# Patient Record
Sex: Female | Born: 1955 | Race: White | Hispanic: No | Marital: Married | Smoking: Current every day smoker
Health system: Southern US, Community
[De-identification: ages and names within clinical notes are randomized; demographics above are authoritative.]

## PROBLEM LIST (undated history)

## (undated) DIAGNOSIS — A0472 Enterocolitis due to Clostridium difficile, not specified as recurrent: Secondary | ICD-10-CM

## (undated) DIAGNOSIS — I10 Essential (primary) hypertension: Secondary | ICD-10-CM

## (undated) HISTORY — DX: Essential (primary) hypertension: I10

## (undated) HISTORY — DX: Enterocolitis due to Clostridium difficile, not specified as recurrent: A04.72

## (undated) HISTORY — PX: NASAL SINUS SURGERY: SHX719

## (undated) HISTORY — PX: OTHER SURGICAL HISTORY: SHX169

## (undated) HISTORY — PX: ABDOMINAL HYSTERECTOMY: SHX81

## (undated) HISTORY — PX: PELVIC LAPAROSCOPY: SHX162

---

## 1978-05-27 HISTORY — PX: CERVICAL CONE BIOPSY: SUR198

## 1998-07-04 ENCOUNTER — Ambulatory Visit (HOSPITAL_COMMUNITY): Admission: RE | Admit: 1998-07-04 | Discharge: 1998-07-04 | Payer: Self-pay | Admitting: Family Medicine

## 1998-07-04 ENCOUNTER — Encounter: Payer: Self-pay | Admitting: Family Medicine

## 1998-10-04 ENCOUNTER — Encounter: Payer: Self-pay | Admitting: Family Medicine

## 1998-10-04 ENCOUNTER — Ambulatory Visit (HOSPITAL_COMMUNITY): Admission: RE | Admit: 1998-10-04 | Discharge: 1998-10-04 | Payer: Self-pay | Admitting: Family Medicine

## 1998-12-28 ENCOUNTER — Encounter: Payer: Self-pay | Admitting: *Deleted

## 1998-12-28 ENCOUNTER — Ambulatory Visit (HOSPITAL_COMMUNITY): Admission: RE | Admit: 1998-12-28 | Discharge: 1998-12-28 | Payer: Self-pay | Admitting: *Deleted

## 1999-03-21 ENCOUNTER — Other Ambulatory Visit: Admission: RE | Admit: 1999-03-21 | Discharge: 1999-03-21 | Payer: Self-pay | Admitting: *Deleted

## 1999-03-21 ENCOUNTER — Encounter (INDEPENDENT_AMBULATORY_CARE_PROVIDER_SITE_OTHER): Payer: Self-pay | Admitting: Specialist

## 2000-04-29 ENCOUNTER — Encounter: Payer: Self-pay | Admitting: Family Medicine

## 2000-04-29 ENCOUNTER — Encounter: Admission: RE | Admit: 2000-04-29 | Discharge: 2000-04-29 | Payer: Self-pay | Admitting: Family Medicine

## 2001-03-13 ENCOUNTER — Ambulatory Visit (HOSPITAL_COMMUNITY): Admission: RE | Admit: 2001-03-13 | Discharge: 2001-03-13 | Payer: Self-pay | Admitting: Family Medicine

## 2001-07-17 ENCOUNTER — Other Ambulatory Visit: Admission: RE | Admit: 2001-07-17 | Discharge: 2001-07-17 | Payer: Self-pay | Admitting: Obstetrics and Gynecology

## 2002-09-17 ENCOUNTER — Encounter: Payer: Self-pay | Admitting: Emergency Medicine

## 2002-09-17 ENCOUNTER — Emergency Department (HOSPITAL_COMMUNITY): Admission: EM | Admit: 2002-09-17 | Discharge: 2002-09-17 | Payer: Self-pay | Admitting: Emergency Medicine

## 2002-10-13 ENCOUNTER — Other Ambulatory Visit: Admission: RE | Admit: 2002-10-13 | Discharge: 2002-10-13 | Payer: Self-pay | Admitting: Obstetrics and Gynecology

## 2003-02-17 ENCOUNTER — Encounter: Payer: Self-pay | Admitting: Obstetrics and Gynecology

## 2003-02-17 ENCOUNTER — Encounter: Admission: RE | Admit: 2003-02-17 | Discharge: 2003-02-17 | Payer: Self-pay | Admitting: Obstetrics and Gynecology

## 2003-06-26 ENCOUNTER — Emergency Department (HOSPITAL_COMMUNITY): Admission: EM | Admit: 2003-06-26 | Discharge: 2003-06-26 | Payer: Self-pay | Admitting: Emergency Medicine

## 2003-10-16 ENCOUNTER — Emergency Department (HOSPITAL_COMMUNITY): Admission: EM | Admit: 2003-10-16 | Discharge: 2003-10-16 | Payer: Self-pay | Admitting: Family Medicine

## 2004-03-14 ENCOUNTER — Other Ambulatory Visit: Admission: RE | Admit: 2004-03-14 | Discharge: 2004-03-14 | Payer: Self-pay | Admitting: Obstetrics and Gynecology

## 2004-07-28 ENCOUNTER — Emergency Department (HOSPITAL_COMMUNITY): Admission: EM | Admit: 2004-07-28 | Discharge: 2004-07-28 | Payer: Self-pay | Admitting: Emergency Medicine

## 2004-09-13 ENCOUNTER — Encounter: Admission: RE | Admit: 2004-09-13 | Discharge: 2004-09-13 | Payer: Self-pay | Admitting: Podiatry

## 2005-03-22 ENCOUNTER — Encounter: Admission: RE | Admit: 2005-03-22 | Discharge: 2005-03-22 | Payer: Self-pay | Admitting: Family Medicine

## 2005-09-11 ENCOUNTER — Other Ambulatory Visit: Admission: RE | Admit: 2005-09-11 | Discharge: 2005-09-11 | Payer: Self-pay | Admitting: Gynecology

## 2005-10-17 ENCOUNTER — Encounter: Admission: RE | Admit: 2005-10-17 | Discharge: 2005-10-17 | Payer: Self-pay | Admitting: Obstetrics and Gynecology

## 2006-03-24 ENCOUNTER — Encounter: Admission: RE | Admit: 2006-03-24 | Discharge: 2006-03-24 | Payer: Self-pay | Admitting: Family Medicine

## 2006-04-02 ENCOUNTER — Encounter: Admission: RE | Admit: 2006-04-02 | Discharge: 2006-04-02 | Payer: Self-pay | Admitting: Family Medicine

## 2007-03-02 ENCOUNTER — Encounter: Admission: RE | Admit: 2007-03-02 | Discharge: 2007-03-02 | Payer: Self-pay | Admitting: Obstetrics and Gynecology

## 2007-05-26 ENCOUNTER — Other Ambulatory Visit: Admission: RE | Admit: 2007-05-26 | Discharge: 2007-05-26 | Payer: Self-pay | Admitting: Obstetrics and Gynecology

## 2008-01-27 ENCOUNTER — Encounter: Admission: RE | Admit: 2008-01-27 | Discharge: 2008-01-27 | Payer: Self-pay | Admitting: Family Medicine

## 2008-05-21 ENCOUNTER — Emergency Department (HOSPITAL_COMMUNITY): Admission: EM | Admit: 2008-05-21 | Discharge: 2008-05-22 | Payer: Self-pay | Admitting: Emergency Medicine

## 2008-07-14 ENCOUNTER — Encounter: Admission: RE | Admit: 2008-07-14 | Discharge: 2008-07-14 | Payer: Self-pay | Admitting: Family Medicine

## 2008-11-09 ENCOUNTER — Ambulatory Visit: Payer: Self-pay | Admitting: Obstetrics and Gynecology

## 2008-11-09 ENCOUNTER — Other Ambulatory Visit: Admission: RE | Admit: 2008-11-09 | Discharge: 2008-11-09 | Payer: Self-pay | Admitting: Obstetrics and Gynecology

## 2008-11-09 ENCOUNTER — Encounter: Payer: Self-pay | Admitting: Obstetrics and Gynecology

## 2008-11-22 ENCOUNTER — Encounter: Admission: RE | Admit: 2008-11-22 | Discharge: 2008-11-22 | Payer: Self-pay | Admitting: Obstetrics and Gynecology

## 2009-05-17 ENCOUNTER — Emergency Department (HOSPITAL_COMMUNITY): Admission: EM | Admit: 2009-05-17 | Discharge: 2009-05-17 | Payer: Self-pay | Admitting: Emergency Medicine

## 2010-01-06 IMAGING — CT CT PELVIS W/ CM
2 of 5 series · 16 of 46 positions shown, 18 images · IV contrast (APPLIED)
Comparison: None

CT ABDOMEN

CLINICAL DATA: Abdominal pain

CT ABDOMEN AND PELVIS WITH CONTRAST
TECHNIQUE: Multidetector CT imaging of the abdomen and pelvis was
performed using the standard protocol following bolus
administration of intravenous contrast.
Contrast: 125 ml of omni 300

[Series 2: abd_pel 5.0 b40f st · axial · 0.57mm/px · z∈[-432,-72]mm · 13 of 82 slices shown, 15 images]
[im 5/82  soft-tissue]
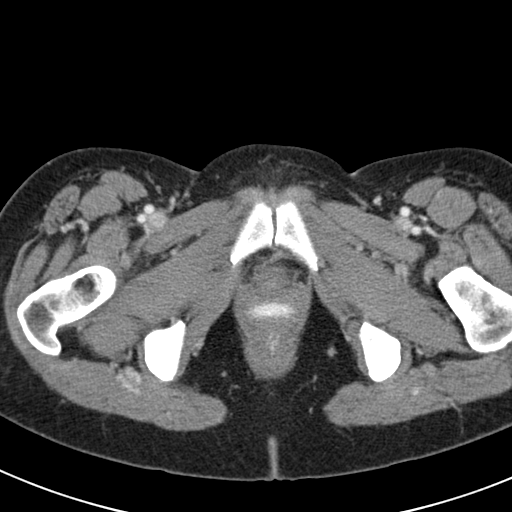
[im 5/82  bone]
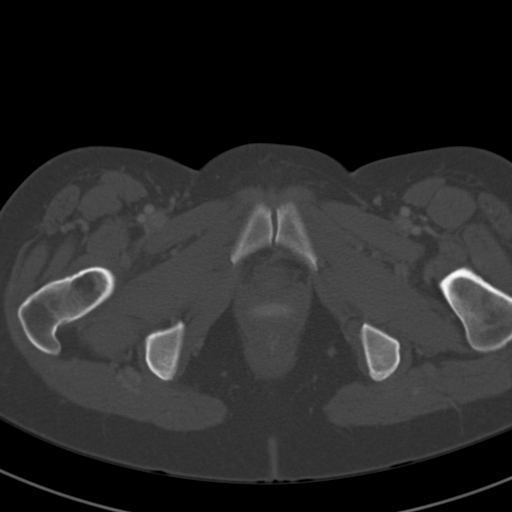
[im 10/82  soft-tissue]
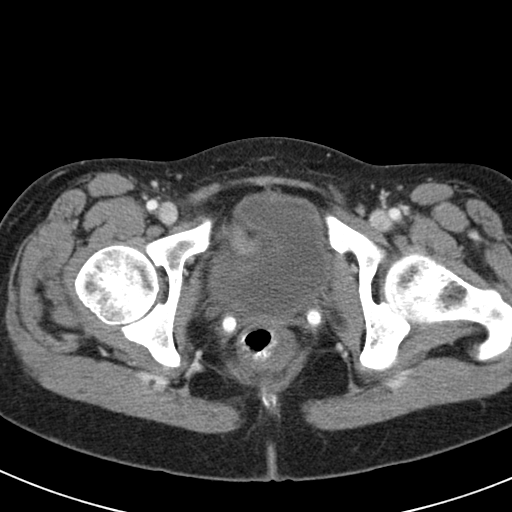
[im 20/82  soft-tissue]
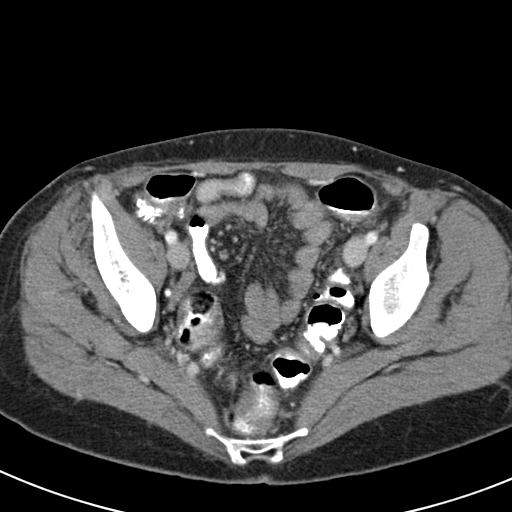
[im 24/82  soft-tissue]
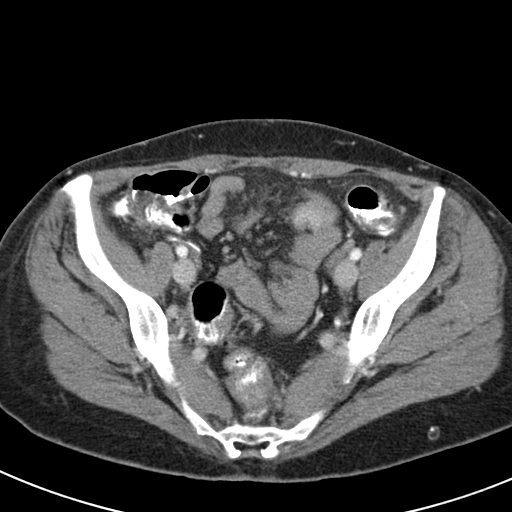
[im 29/82  soft-tissue]
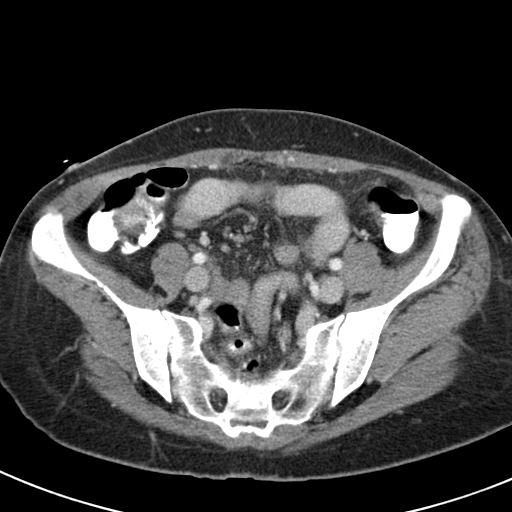
[im 34/82  soft-tissue]
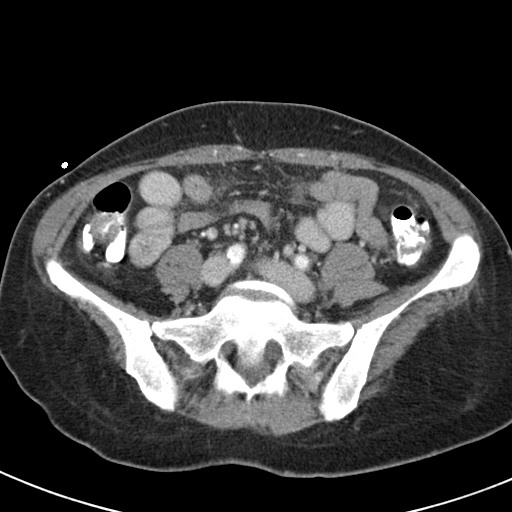
[im 43/82  soft-tissue]
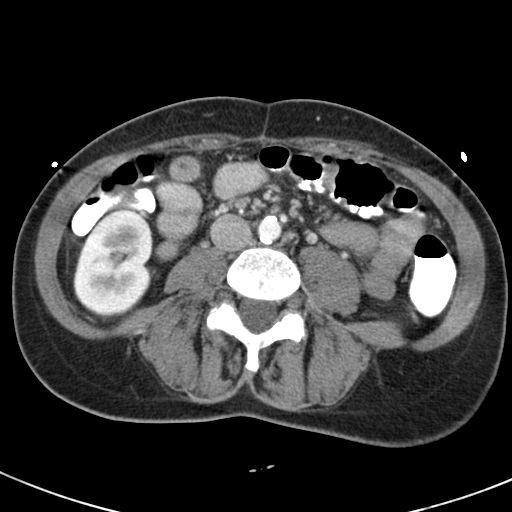
[im 48/82  soft-tissue]
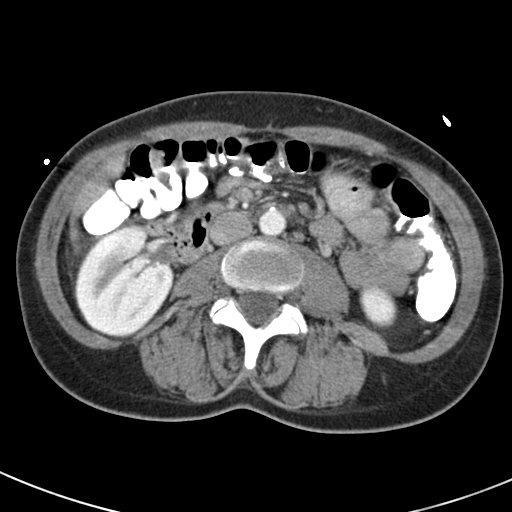
[im 53/82  soft-tissue]
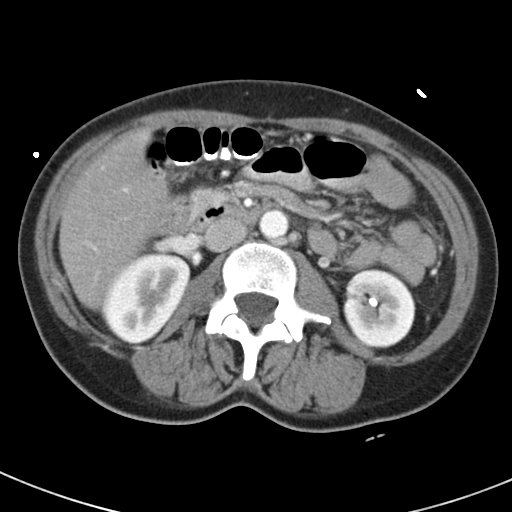
[im 53/82  bone]
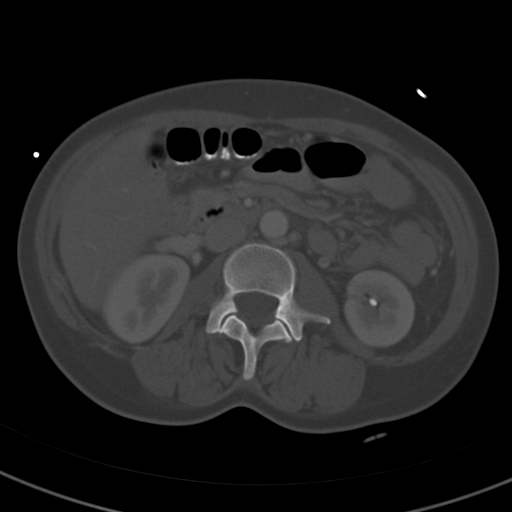
[im 58/82  soft-tissue]
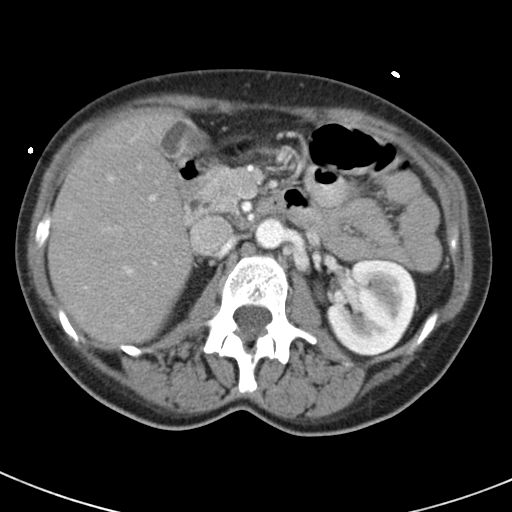
[im 62/82  soft-tissue]
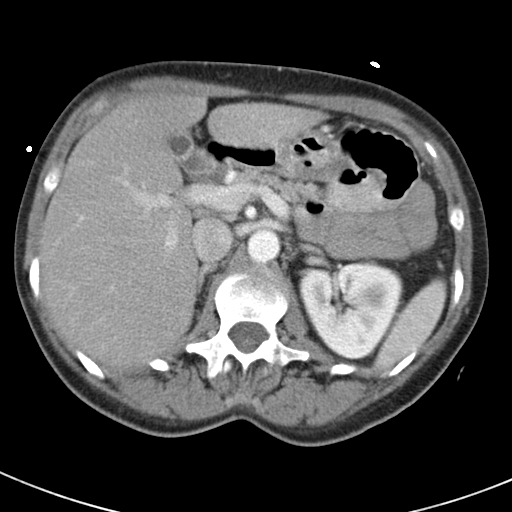
[im 72/82  soft-tissue]
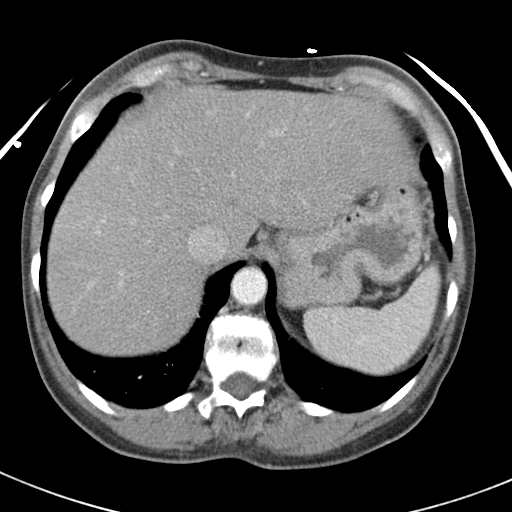
[im 77/82  soft-tissue]
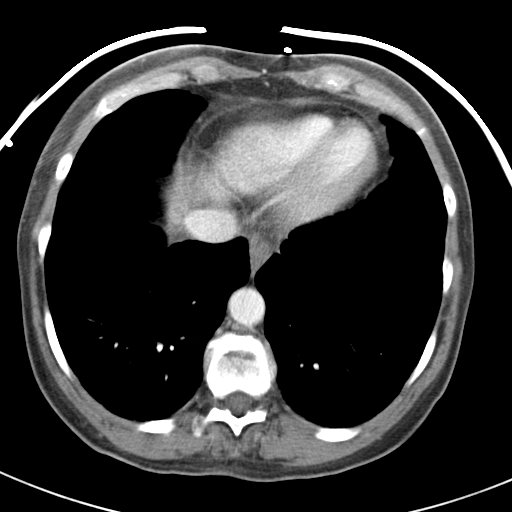

[Series 602: coronal abdomen · coronal · 0.84mm/px · 3 of 116 slices shown]
[im 39/116  soft-tissue]
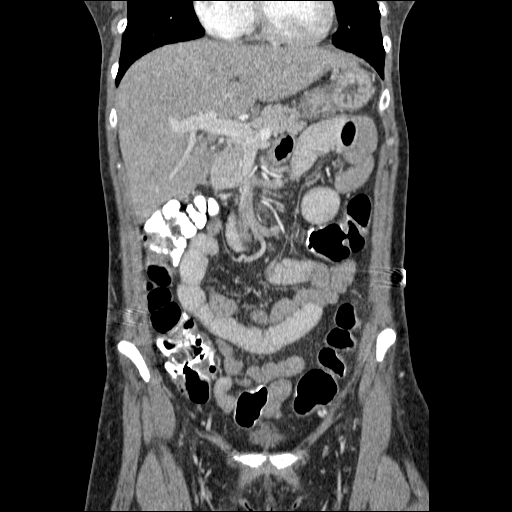
[im 52/116  soft-tissue]
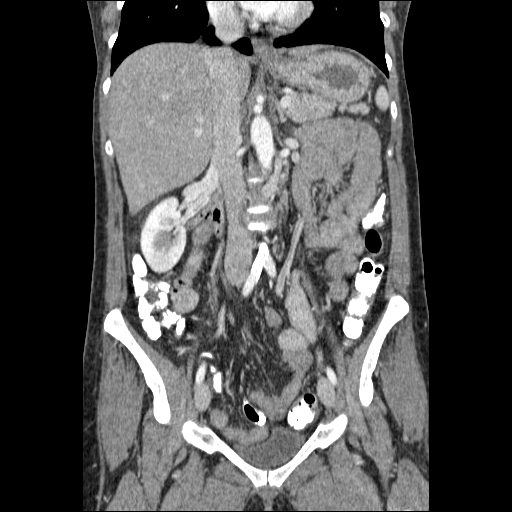
[im 64/116  soft-tissue]
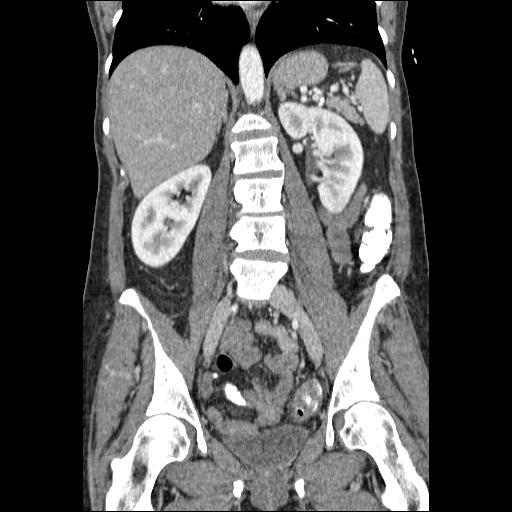

[16 of 46 positions shown; findings below may reference images not displayed]

FINDINGS: There is a small hypodensity in the left hepatic lobe
which measures 5 mm, image #8.

The liver parenchyma is otherwise normal in attenuation and
morphology.

The gallbladder appears normal.

The common bile duct is increased in caliber measuring the 5-6 mm,
image 23. There is mild prominence of the pancreatic duct.  The
pancreatic parenchyma appears normal.

The spleen is normal.

The adrenal glands are normal.

The right kidney is negative.

There is a nonobstructing stone within the lower pole of the left
kidney measuring 5 mm, image 30.

No enlarged retroperitoneal or small bowel mesenteric lymph nodes
identified.

There are no free fluid or abnormal fluid collections.

No abnormal bowel wall thickening identified.

There is no evidence for bowel obstruction.

 Enteric contrast material is identified up to the level of the
rectum.
IMPRESSION: 1.  No acute upper abdominal CT findings.
2.  Prominence of the common bile duct and pancreatic duct without
evidence for obstructing mass or choledocholithiasis.
3.  Nonobstructing stone within the left kidney.

CT PELVIS
FINDINGS: Negative for free fluid.

The wall of the rectosigmoid colon is prominent.  This may reflect
incomplete distention.

No enlarged pelvic or inguinal lymph nodes noted.

The urinary bladder appears normal.

The pelvic small bowel loops are normal.

Review of the visualized osseous structures is negative.

The patient is noted to have a pessary
IMPRESSION: 1.  Prominence of the of the rectosigmoid colon.  This is likely
due to incomplete distention.  Correlate for any clinical signs or
symptoms of colitis/proctitis.

## 2010-05-07 ENCOUNTER — Ambulatory Visit: Payer: Self-pay | Admitting: Obstetrics and Gynecology

## 2010-06-17 ENCOUNTER — Encounter: Payer: Self-pay | Admitting: Diagnostic Radiology

## 2010-06-17 ENCOUNTER — Encounter: Payer: Self-pay | Admitting: Obstetrics and Gynecology

## 2010-06-17 ENCOUNTER — Encounter: Payer: Self-pay | Admitting: Family Medicine

## 2010-07-24 ENCOUNTER — Other Ambulatory Visit (HOSPITAL_COMMUNITY)
Admission: RE | Admit: 2010-07-24 | Discharge: 2010-07-24 | Disposition: A | Discharge status: Home / Self Care | Payer: PRIVATE HEALTH INSURANCE | Source: Ambulatory Visit | Attending: Obstetrics and Gynecology | Admitting: Obstetrics and Gynecology

## 2010-07-24 ENCOUNTER — Encounter (INDEPENDENT_AMBULATORY_CARE_PROVIDER_SITE_OTHER): Payer: PRIVATE HEALTH INSURANCE | Admitting: Obstetrics and Gynecology

## 2010-07-24 ENCOUNTER — Other Ambulatory Visit: Payer: Self-pay | Admitting: Obstetrics and Gynecology

## 2010-07-24 DIAGNOSIS — R82998 Other abnormal findings in urine: Secondary | ICD-10-CM

## 2010-07-24 DIAGNOSIS — Z01419 Encounter for gynecological examination (general) (routine) without abnormal findings: Secondary | ICD-10-CM

## 2010-07-24 DIAGNOSIS — Z124 Encounter for screening for malignant neoplasm of cervix: Secondary | ICD-10-CM | POA: Insufficient documentation

## 2010-10-24 ENCOUNTER — Other Ambulatory Visit: Payer: Self-pay | Admitting: Neurosurgery

## 2011-02-28 LAB — URINALYSIS, ROUTINE W REFLEX MICROSCOPIC
Ketones, ur: >80 mg/dL — AB
Nitrite: NEGATIVE
Protein, ur: 30 mg/dL — AB

## 2011-02-28 LAB — GLUCOSE, CAPILLARY: Glucose-Capillary: 122 mg/dL — ABNORMAL HIGH (ref 70–99)

## 2011-02-28 LAB — COMPREHENSIVE METABOLIC PANEL
Alkaline Phosphatase: 65 U/L (ref 39–117)
BUN: 17 mg/dL (ref 6–23)
CO2: 23 mEq/L (ref 19–32)
GFR calc non Af Amer: >60 mL/min (ref >60)
Glucose, Bld: 122 mg/dL — ABNORMAL HIGH (ref 70–99)
Potassium: 3.3 mEq/L — ABNORMAL LOW (ref 3.5–5.1)
Total Bilirubin: 1.1 mg/dL (ref 0.3–1.2)
Total Protein: 6.6 g/dL (ref 6.0–8.3)

## 2011-02-28 LAB — CBC
HCT: 51.5 % — ABNORMAL HIGH (ref 36.0–46.0)
Hemoglobin: 17.9 g/dL — ABNORMAL HIGH (ref 12.0–15.0)
MCHC: 34.7 g/dL (ref 30.0–36.0)
RBC: 5.07 MIL/uL (ref 3.87–5.11)
RDW: 13.1 % (ref 11.5–15.5)

## 2011-02-28 LAB — DIFFERENTIAL
Basophils Absolute: 0.2 10*3/uL — ABNORMAL HIGH (ref 0.0–0.1)
Basophils Relative: 1 % (ref 0–1)
Eosinophils Absolute: 0 10*3/uL (ref 0.0–0.7)
Monocytes Relative: 5 % (ref 3–12)
Neutro Abs: 14.7 10*3/uL — ABNORMAL HIGH (ref 1.7–7.7)
Neutrophils Relative %: 85 % — ABNORMAL HIGH (ref 43–77)

## 2011-02-28 LAB — LIPASE, BLOOD: Lipase: 23 U/L (ref 11–59)

## 2011-02-28 LAB — URINE MICROSCOPIC-ADD ON

## 2011-07-07 ENCOUNTER — Other Ambulatory Visit: Payer: Self-pay | Admitting: Obstetrics and Gynecology

## 2011-12-30 ENCOUNTER — Other Ambulatory Visit: Payer: Self-pay | Admitting: Obstetrics and Gynecology

## 2012-01-07 ENCOUNTER — Encounter: Payer: PRIVATE HEALTH INSURANCE | Admitting: Obstetrics and Gynecology

## 2012-01-09 ENCOUNTER — Encounter: Payer: Self-pay | Admitting: Gynecology

## 2012-01-09 DIAGNOSIS — I10 Essential (primary) hypertension: Secondary | ICD-10-CM | POA: Insufficient documentation

## 2012-01-09 DIAGNOSIS — A0472 Enterocolitis due to Clostridium difficile, not specified as recurrent: Secondary | ICD-10-CM | POA: Insufficient documentation

## 2012-01-21 ENCOUNTER — Encounter: Payer: PRIVATE HEALTH INSURANCE | Admitting: Obstetrics and Gynecology

## 2012-07-13 ENCOUNTER — Telehealth: Payer: Self-pay | Admitting: *Deleted

## 2012-07-13 MED ORDER — ESTRADIOL ACETATE 0.05 MG/24HR VA RING: VAGINAL_RING | VAGINAL | Status: DC

## 2012-07-13 NOTE — Telephone Encounter (Signed)
Pt calling requesting refill on femring, annual schedule on 07/24/12. 1 femring sent to pharmacy.

## 2012-07-24 ENCOUNTER — Ambulatory Visit (INDEPENDENT_AMBULATORY_CARE_PROVIDER_SITE_OTHER): Payer: PRIVATE HEALTH INSURANCE | Admitting: Gynecology

## 2012-07-24 ENCOUNTER — Encounter: Payer: Self-pay | Admitting: Gynecology

## 2012-07-24 VITALS — BP 124/78 | Ht 63.0 in | Wt 128.0 lb

## 2012-07-24 DIAGNOSIS — N952 Postmenopausal atrophic vaginitis: Secondary | ICD-10-CM

## 2012-07-24 DIAGNOSIS — M810 Age-related osteoporosis without current pathological fracture: Secondary | ICD-10-CM

## 2012-07-24 DIAGNOSIS — IMO0002 Reserved for concepts with insufficient information to code with codable children: Secondary | ICD-10-CM

## 2012-07-24 DIAGNOSIS — Z01419 Encounter for gynecological examination (general) (routine) without abnormal findings: Secondary | ICD-10-CM

## 2012-07-24 MED ORDER — ESTRADIOL ACETATE 0.1 MG/24HR VA RING: VAGINAL_RING | VAGINAL | Status: DC

## 2012-07-24 NOTE — Patient Instructions (Signed)
Followup for bone density as scheduled. Let me know how the increased Femring dose does. Followup in one year for annual exam.

## 2012-07-24 NOTE — Progress Notes (Signed)
Aiyanah Kalama [place] 20-Mar-1956 [id number]        57 y.o.  [id number] for annual exam.  Former patient of Dr. Eda Paschal with several issues noted below.  Past medical history,surgical history, medications, allergies, family history and social history were all reviewed and documented in the EPIC chart. ROS:  Was performed and pertinent positives and negatives are included in the history.  Exam: Kim assistant Filed Vitals:   07/24/12 1044  BP: 124/78  Height: 5\' 3"  (1.6 m)  Weight: 128 lb (58.06 kg)   General appearance  Normal Skin grossly normal Head/Neck normal with no cervical or supraclavicular adenopathy thyroid normal Lungs  clear Cardiac RR, without RMG Abdominal  soft, nontender, without masses, organomegaly or hernia Breasts  examined lying and sitting without masses, retractions, discharge or axillary adenopathy. Pelvic  Ext/BUS/vagina  tight introitus with atrophic changes. Normal vaginal depth.  Adnexa  Without masses or tenderness    Anus and perineum  normal   Rectovaginal  normal sphincter tone without palpated masses or tenderness.    Assessment/Plan:  56 y.o. [id number] female for annual exam.   1. 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 Feels tight with attempted intercourse. She is tight on introital entrance. Using Femring 0.05. Previously on Premarin number of years ago but was switched over. She was taking this for vasomotor symptoms. Options to include dilatation reviewed. Increasing her Femring 0.1 also discussed.  The whole issue of HRT, WHI study to include the risks of stroke heart attack DVT and breast cancer were reviewed. First-pass affect issues with advantage of transdermal/transvaginal over oral. Patient wants to try the higher Femring and I prescribed Femring 0.1x1 year.   2. Osteoporosis with T score -2.9. Had discussed treatment options with Dr. Eda Paschal and she was afraid of bisphosphonates her main side effect profile. They talked about per  liter but she never followed up for this. I reviewed the devastating effects of fracture to include hip fracture and my strong recommendation that she consider treatment. I discussed the risks of osteonecrosis of the jaw atypical fractures GERD esophageal cancer. We'll go ahead and get baseline DEXA now and she'll followup for treatment discussion. 3. Pap smear 2012. No Pap smear done today. Does have history of cone biopsy in 1980 but has normal Pap smears since then historically. She is status post hysterectomy for benign indications. Options to stop screening altogether or less frequent intervals reviewed and we'll readdress on an annual basis. 4. Mammography 2010. Strongly recommended she schedule mammography and she agrees to do so. SBE monthly reviewed. 5. Colonoscopy 2012. 6. Health maintenance. No blood work done as it is all done through her primary physician's office. Followup for DEXA and increased Femring response. Otherwise followup 1 year.     Dara Lords MD, 11:23 AM 07/24/2012

## 2012-07-25 LAB — URINALYSIS W MICROSCOPIC + REFLEX CULTURE
Glucose, UA: NEGATIVE mg/dL
Hgb urine dipstick: NEGATIVE
Leukocytes, UA: NEGATIVE
Nitrite: NEGATIVE
Protein, ur: NEGATIVE mg/dL
Squamous Epithelial / LPF: NONE SEEN
Urobilinogen, UA: 0.2 mg/dL (ref 0.0–1.0)

## 2012-10-05 ENCOUNTER — Telehealth: Payer: Self-pay | Admitting: *Deleted

## 2012-10-05 MED ORDER — ESTRADIOL ACETATE 0.1 MG/24HR VA RING: VAGINAL_RING | VAGINAL | Status: AC

## 2012-10-05 NOTE — Telephone Encounter (Signed)
Pt didn't realize TF sent her Rx for femring 0.1 mg to pharmacy at [facility] 07/24/12. Pt never picked up rx, rx was placed back, I will send rx again for pt to pick up. Pt informed with this as well.

## 2014-03-28 ENCOUNTER — Encounter: Payer: Self-pay | Admitting: Gynecology

## 2015-01-23 ENCOUNTER — Encounter: Payer: Self-pay | Admitting: Gynecology

## 2019-03-03 DIAGNOSIS — K219 Gastro-esophageal reflux disease without esophagitis: Secondary | ICD-10-CM | POA: Diagnosis not present

## 2019-03-03 DIAGNOSIS — E782 Mixed hyperlipidemia: Secondary | ICD-10-CM | POA: Diagnosis not present

## 2019-03-03 DIAGNOSIS — Z0000 Encounter for general adult medical examination without abnormal findings: Secondary | ICD-10-CM | POA: Diagnosis not present

## 2019-03-03 DIAGNOSIS — G894 Chronic pain syndrome: Secondary | ICD-10-CM | POA: Diagnosis not present

## 2019-03-03 DIAGNOSIS — I10 Essential (primary) hypertension: Secondary | ICD-10-CM | POA: Diagnosis not present

## 2019-03-03 DIAGNOSIS — Z1231 Encounter for screening mammogram for malignant neoplasm of breast: Secondary | ICD-10-CM | POA: Diagnosis not present

## 2019-03-03 DIAGNOSIS — R197 Diarrhea, unspecified: Secondary | ICD-10-CM | POA: Diagnosis not present

## 2019-03-03 DIAGNOSIS — E2839 Other primary ovarian failure: Secondary | ICD-10-CM | POA: Diagnosis not present

## 2019-03-03 DIAGNOSIS — R69 Illness, unspecified: Secondary | ICD-10-CM | POA: Diagnosis not present

## 2019-03-03 DIAGNOSIS — E559 Vitamin D deficiency, unspecified: Secondary | ICD-10-CM | POA: Diagnosis not present

## 2019-03-09 ENCOUNTER — Other Ambulatory Visit: Payer: Self-pay | Admitting: Family Medicine

## 2019-03-09 DIAGNOSIS — Z1231 Encounter for screening mammogram for malignant neoplasm of breast: Secondary | ICD-10-CM

## 2019-03-09 DIAGNOSIS — E2839 Other primary ovarian failure: Secondary | ICD-10-CM

## 2019-05-27 ENCOUNTER — Ambulatory Visit
Admission: RE | Admit: 2019-05-27 | Discharge: 2019-05-27 | Disposition: A | Discharge status: Home / Self Care | Payer: Medicare HMO | Source: Ambulatory Visit | Attending: Family Medicine | Admitting: Family Medicine

## 2019-05-27 ENCOUNTER — Other Ambulatory Visit: Payer: Self-pay

## 2019-05-27 DIAGNOSIS — Z1231 Encounter for screening mammogram for malignant neoplasm of breast: Secondary | ICD-10-CM

## 2019-05-27 DIAGNOSIS — E2839 Other primary ovarian failure: Secondary | ICD-10-CM

## 2019-05-27 DIAGNOSIS — Z780 Asymptomatic menopausal state: Secondary | ICD-10-CM | POA: Diagnosis not present

## 2019-05-27 DIAGNOSIS — M810 Age-related osteoporosis without current pathological fracture: Secondary | ICD-10-CM | POA: Diagnosis not present

## 2019-05-27 DIAGNOSIS — M8588 Other specified disorders of bone density and structure, other site: Secondary | ICD-10-CM | POA: Diagnosis not present

## 2019-06-07 DIAGNOSIS — I10 Essential (primary) hypertension: Secondary | ICD-10-CM | POA: Diagnosis not present

## 2019-06-07 DIAGNOSIS — E782 Mixed hyperlipidemia: Secondary | ICD-10-CM | POA: Diagnosis not present

## 2019-06-07 DIAGNOSIS — R197 Diarrhea, unspecified: Secondary | ICD-10-CM | POA: Diagnosis not present

## 2019-06-07 DIAGNOSIS — G894 Chronic pain syndrome: Secondary | ICD-10-CM | POA: Diagnosis not present

## 2019-06-07 DIAGNOSIS — M810 Age-related osteoporosis without current pathological fracture: Secondary | ICD-10-CM | POA: Diagnosis not present

## 2019-06-07 DIAGNOSIS — R69 Illness, unspecified: Secondary | ICD-10-CM | POA: Diagnosis not present

## 2019-07-27 DIAGNOSIS — I10 Essential (primary) hypertension: Secondary | ICD-10-CM | POA: Diagnosis not present

## 2019-08-24 DIAGNOSIS — I10 Essential (primary) hypertension: Secondary | ICD-10-CM | POA: Diagnosis not present

## 2019-08-24 DIAGNOSIS — M810 Age-related osteoporosis without current pathological fracture: Secondary | ICD-10-CM | POA: Diagnosis not present

## 2019-08-24 DIAGNOSIS — E782 Mixed hyperlipidemia: Secondary | ICD-10-CM | POA: Diagnosis not present

## 2019-08-24 DIAGNOSIS — R69 Illness, unspecified: Secondary | ICD-10-CM | POA: Diagnosis not present

## 2019-08-25 DIAGNOSIS — I10 Essential (primary) hypertension: Secondary | ICD-10-CM | POA: Diagnosis not present

## 2019-09-07 DIAGNOSIS — R69 Illness, unspecified: Secondary | ICD-10-CM | POA: Diagnosis not present

## 2019-09-07 DIAGNOSIS — I10 Essential (primary) hypertension: Secondary | ICD-10-CM | POA: Diagnosis not present

## 2019-09-07 DIAGNOSIS — G894 Chronic pain syndrome: Secondary | ICD-10-CM | POA: Diagnosis not present

## 2019-09-07 DIAGNOSIS — R197 Diarrhea, unspecified: Secondary | ICD-10-CM | POA: Diagnosis not present

## 2019-09-07 DIAGNOSIS — E782 Mixed hyperlipidemia: Secondary | ICD-10-CM | POA: Diagnosis not present

## 2019-09-07 DIAGNOSIS — K219 Gastro-esophageal reflux disease without esophagitis: Secondary | ICD-10-CM | POA: Diagnosis not present

## 2019-09-13 DIAGNOSIS — H2513 Age-related nuclear cataract, bilateral: Secondary | ICD-10-CM | POA: Diagnosis not present

## 2019-09-13 DIAGNOSIS — H11823 Conjunctivochalasis, bilateral: Secondary | ICD-10-CM | POA: Diagnosis not present

## 2019-09-13 DIAGNOSIS — H10413 Chronic giant papillary conjunctivitis, bilateral: Secondary | ICD-10-CM | POA: Diagnosis not present

## 2019-09-13 DIAGNOSIS — H0102A Squamous blepharitis right eye, upper and lower eyelids: Secondary | ICD-10-CM | POA: Diagnosis not present

## 2019-09-13 DIAGNOSIS — H0102B Squamous blepharitis left eye, upper and lower eyelids: Secondary | ICD-10-CM | POA: Diagnosis not present

## 2019-09-13 DIAGNOSIS — H04123 Dry eye syndrome of bilateral lacrimal glands: Secondary | ICD-10-CM | POA: Diagnosis not present

## 2019-09-21 DIAGNOSIS — R69 Illness, unspecified: Secondary | ICD-10-CM | POA: Diagnosis not present

## 2019-09-21 DIAGNOSIS — I10 Essential (primary) hypertension: Secondary | ICD-10-CM | POA: Diagnosis not present

## 2019-09-21 DIAGNOSIS — M810 Age-related osteoporosis without current pathological fracture: Secondary | ICD-10-CM | POA: Diagnosis not present

## 2019-09-21 DIAGNOSIS — E782 Mixed hyperlipidemia: Secondary | ICD-10-CM | POA: Diagnosis not present

## 2019-10-22 DIAGNOSIS — M810 Age-related osteoporosis without current pathological fracture: Secondary | ICD-10-CM | POA: Diagnosis not present

## 2019-10-22 DIAGNOSIS — I10 Essential (primary) hypertension: Secondary | ICD-10-CM | POA: Diagnosis not present

## 2019-10-22 DIAGNOSIS — R69 Illness, unspecified: Secondary | ICD-10-CM | POA: Diagnosis not present

## 2019-10-22 DIAGNOSIS — E782 Mixed hyperlipidemia: Secondary | ICD-10-CM | POA: Diagnosis not present

## 2019-11-24 DIAGNOSIS — E782 Mixed hyperlipidemia: Secondary | ICD-10-CM | POA: Diagnosis not present

## 2019-11-24 DIAGNOSIS — I10 Essential (primary) hypertension: Secondary | ICD-10-CM | POA: Diagnosis not present

## 2019-11-24 DIAGNOSIS — R69 Illness, unspecified: Secondary | ICD-10-CM | POA: Diagnosis not present

## 2019-11-24 DIAGNOSIS — M810 Age-related osteoporosis without current pathological fracture: Secondary | ICD-10-CM | POA: Diagnosis not present

## 2019-12-16 DIAGNOSIS — R69 Illness, unspecified: Secondary | ICD-10-CM | POA: Diagnosis not present

## 2019-12-16 DIAGNOSIS — M810 Age-related osteoporosis without current pathological fracture: Secondary | ICD-10-CM | POA: Diagnosis not present

## 2019-12-16 DIAGNOSIS — I10 Essential (primary) hypertension: Secondary | ICD-10-CM | POA: Diagnosis not present

## 2019-12-16 DIAGNOSIS — E782 Mixed hyperlipidemia: Secondary | ICD-10-CM | POA: Diagnosis not present

## 2020-01-25 DIAGNOSIS — R69 Illness, unspecified: Secondary | ICD-10-CM | POA: Diagnosis not present

## 2020-01-25 DIAGNOSIS — M810 Age-related osteoporosis without current pathological fracture: Secondary | ICD-10-CM | POA: Diagnosis not present

## 2020-01-25 DIAGNOSIS — E782 Mixed hyperlipidemia: Secondary | ICD-10-CM | POA: Diagnosis not present

## 2020-01-25 DIAGNOSIS — I10 Essential (primary) hypertension: Secondary | ICD-10-CM | POA: Diagnosis not present

## 2020-01-27 DIAGNOSIS — G894 Chronic pain syndrome: Secondary | ICD-10-CM | POA: Diagnosis not present

## 2020-01-27 DIAGNOSIS — R69 Illness, unspecified: Secondary | ICD-10-CM | POA: Diagnosis not present

## 2020-01-27 DIAGNOSIS — E782 Mixed hyperlipidemia: Secondary | ICD-10-CM | POA: Diagnosis not present

## 2020-01-27 DIAGNOSIS — R197 Diarrhea, unspecified: Secondary | ICD-10-CM | POA: Diagnosis not present

## 2020-01-27 DIAGNOSIS — Z23 Encounter for immunization: Secondary | ICD-10-CM | POA: Diagnosis not present

## 2020-01-27 DIAGNOSIS — I10 Essential (primary) hypertension: Secondary | ICD-10-CM | POA: Diagnosis not present

## 2020-02-18 DIAGNOSIS — E782 Mixed hyperlipidemia: Secondary | ICD-10-CM | POA: Diagnosis not present

## 2020-02-18 DIAGNOSIS — M810 Age-related osteoporosis without current pathological fracture: Secondary | ICD-10-CM | POA: Diagnosis not present

## 2020-02-18 DIAGNOSIS — I10 Essential (primary) hypertension: Secondary | ICD-10-CM | POA: Diagnosis not present

## 2020-02-18 DIAGNOSIS — R69 Illness, unspecified: Secondary | ICD-10-CM | POA: Diagnosis not present

## 2020-03-08 DIAGNOSIS — Z1159 Encounter for screening for other viral diseases: Secondary | ICD-10-CM | POA: Diagnosis not present

## 2020-03-08 DIAGNOSIS — M810 Age-related osteoporosis without current pathological fracture: Secondary | ICD-10-CM | POA: Diagnosis not present

## 2020-03-08 DIAGNOSIS — R69 Illness, unspecified: Secondary | ICD-10-CM | POA: Diagnosis not present

## 2020-03-08 DIAGNOSIS — E782 Mixed hyperlipidemia: Secondary | ICD-10-CM | POA: Diagnosis not present

## 2020-03-08 DIAGNOSIS — G894 Chronic pain syndrome: Secondary | ICD-10-CM | POA: Diagnosis not present

## 2020-03-08 DIAGNOSIS — Z0000 Encounter for general adult medical examination without abnormal findings: Secondary | ICD-10-CM | POA: Diagnosis not present

## 2020-03-08 DIAGNOSIS — Z1211 Encounter for screening for malignant neoplasm of colon: Secondary | ICD-10-CM | POA: Diagnosis not present

## 2020-03-08 DIAGNOSIS — I10 Essential (primary) hypertension: Secondary | ICD-10-CM | POA: Diagnosis not present

## 2020-03-16 DIAGNOSIS — E782 Mixed hyperlipidemia: Secondary | ICD-10-CM | POA: Diagnosis not present

## 2020-03-16 DIAGNOSIS — R69 Illness, unspecified: Secondary | ICD-10-CM | POA: Diagnosis not present

## 2020-03-16 DIAGNOSIS — M810 Age-related osteoporosis without current pathological fracture: Secondary | ICD-10-CM | POA: Diagnosis not present

## 2020-03-16 DIAGNOSIS — I10 Essential (primary) hypertension: Secondary | ICD-10-CM | POA: Diagnosis not present

## 2020-03-25 ENCOUNTER — Ambulatory Visit: Payer: PRIVATE HEALTH INSURANCE | Attending: Internal Medicine

## 2020-03-25 DIAGNOSIS — Z23 Encounter for immunization: Secondary | ICD-10-CM

## 2020-03-25 NOTE — Progress Notes (Signed)
   Covid-[facility] Vaccination [facility]  Name:  JAIMARIE RAPOZO    MRN: [id number] DOB: 1956/01/11  03/25/2020  Ms. Twining was observed post Covid-19 immunization for 15 minutes without incident. She was provided with Vaccine Information Sheet and instruction to access the V-Safe system.   Ms. Greulich was instructed to call 911 with any severe reactions post vaccine: Marland Kitchen Difficulty breathing  . Swelling of face and throat  . A fast heartbeat  . A bad rash all over body  . Dizziness and weakness

## 2020-04-11 DIAGNOSIS — K219 Gastro-esophageal reflux disease without esophagitis: Secondary | ICD-10-CM | POA: Diagnosis not present

## 2020-04-11 DIAGNOSIS — E782 Mixed hyperlipidemia: Secondary | ICD-10-CM | POA: Diagnosis not present

## 2020-04-11 DIAGNOSIS — R69 Illness, unspecified: Secondary | ICD-10-CM | POA: Diagnosis not present

## 2020-04-11 DIAGNOSIS — M810 Age-related osteoporosis without current pathological fracture: Secondary | ICD-10-CM | POA: Diagnosis not present

## 2020-04-11 DIAGNOSIS — I10 Essential (primary) hypertension: Secondary | ICD-10-CM | POA: Diagnosis not present

## 2020-05-22 DIAGNOSIS — K219 Gastro-esophageal reflux disease without esophagitis: Secondary | ICD-10-CM | POA: Diagnosis not present

## 2020-05-22 DIAGNOSIS — E782 Mixed hyperlipidemia: Secondary | ICD-10-CM | POA: Diagnosis not present

## 2020-05-22 DIAGNOSIS — I10 Essential (primary) hypertension: Secondary | ICD-10-CM | POA: Diagnosis not present

## 2020-05-22 DIAGNOSIS — M810 Age-related osteoporosis without current pathological fracture: Secondary | ICD-10-CM | POA: Diagnosis not present

## 2020-05-22 DIAGNOSIS — R69 Illness, unspecified: Secondary | ICD-10-CM | POA: Diagnosis not present

## 2020-06-16 DIAGNOSIS — E782 Mixed hyperlipidemia: Secondary | ICD-10-CM | POA: Diagnosis not present

## 2020-06-16 DIAGNOSIS — M810 Age-related osteoporosis without current pathological fracture: Secondary | ICD-10-CM | POA: Diagnosis not present

## 2020-06-16 DIAGNOSIS — I10 Essential (primary) hypertension: Secondary | ICD-10-CM | POA: Diagnosis not present

## 2020-06-16 DIAGNOSIS — R69 Illness, unspecified: Secondary | ICD-10-CM | POA: Diagnosis not present

## 2020-06-16 DIAGNOSIS — K219 Gastro-esophageal reflux disease without esophagitis: Secondary | ICD-10-CM | POA: Diagnosis not present

## 2020-07-12 DIAGNOSIS — K219 Gastro-esophageal reflux disease without esophagitis: Secondary | ICD-10-CM | POA: Diagnosis not present

## 2020-07-12 DIAGNOSIS — E782 Mixed hyperlipidemia: Secondary | ICD-10-CM | POA: Diagnosis not present

## 2020-07-12 DIAGNOSIS — I10 Essential (primary) hypertension: Secondary | ICD-10-CM | POA: Diagnosis not present

## 2020-07-12 DIAGNOSIS — M810 Age-related osteoporosis without current pathological fracture: Secondary | ICD-10-CM | POA: Diagnosis not present

## 2020-07-12 DIAGNOSIS — R69 Illness, unspecified: Secondary | ICD-10-CM | POA: Diagnosis not present

## 2020-07-24 DIAGNOSIS — G894 Chronic pain syndrome: Secondary | ICD-10-CM | POA: Diagnosis not present

## 2020-07-24 DIAGNOSIS — I10 Essential (primary) hypertension: Secondary | ICD-10-CM | POA: Diagnosis not present

## 2020-07-24 DIAGNOSIS — R69 Illness, unspecified: Secondary | ICD-10-CM | POA: Diagnosis not present

## 2020-07-24 DIAGNOSIS — E782 Mixed hyperlipidemia: Secondary | ICD-10-CM | POA: Diagnosis not present

## 2020-07-24 DIAGNOSIS — M810 Age-related osteoporosis without current pathological fracture: Secondary | ICD-10-CM | POA: Diagnosis not present

## 2020-08-23 DIAGNOSIS — R69 Illness, unspecified: Secondary | ICD-10-CM | POA: Diagnosis not present

## 2020-08-23 DIAGNOSIS — I10 Essential (primary) hypertension: Secondary | ICD-10-CM | POA: Diagnosis not present

## 2020-08-23 DIAGNOSIS — K219 Gastro-esophageal reflux disease without esophagitis: Secondary | ICD-10-CM | POA: Diagnosis not present

## 2020-08-23 DIAGNOSIS — E782 Mixed hyperlipidemia: Secondary | ICD-10-CM | POA: Diagnosis not present

## 2020-08-23 DIAGNOSIS — M810 Age-related osteoporosis without current pathological fracture: Secondary | ICD-10-CM | POA: Diagnosis not present

## 2020-09-06 DIAGNOSIS — I10 Essential (primary) hypertension: Secondary | ICD-10-CM | POA: Diagnosis not present

## 2020-09-06 DIAGNOSIS — M810 Age-related osteoporosis without current pathological fracture: Secondary | ICD-10-CM | POA: Diagnosis not present

## 2020-09-06 DIAGNOSIS — R69 Illness, unspecified: Secondary | ICD-10-CM | POA: Diagnosis not present

## 2020-09-06 DIAGNOSIS — K219 Gastro-esophageal reflux disease without esophagitis: Secondary | ICD-10-CM | POA: Diagnosis not present

## 2020-09-06 DIAGNOSIS — E782 Mixed hyperlipidemia: Secondary | ICD-10-CM | POA: Diagnosis not present

## 2020-10-18 DIAGNOSIS — I10 Essential (primary) hypertension: Secondary | ICD-10-CM | POA: Diagnosis not present

## 2020-10-18 DIAGNOSIS — Z1211 Encounter for screening for malignant neoplasm of colon: Secondary | ICD-10-CM | POA: Diagnosis not present

## 2020-10-18 DIAGNOSIS — R69 Illness, unspecified: Secondary | ICD-10-CM | POA: Diagnosis not present

## 2020-10-18 DIAGNOSIS — K219 Gastro-esophageal reflux disease without esophagitis: Secondary | ICD-10-CM | POA: Diagnosis not present

## 2020-10-18 DIAGNOSIS — R197 Diarrhea, unspecified: Secondary | ICD-10-CM | POA: Diagnosis not present

## 2020-10-18 DIAGNOSIS — G894 Chronic pain syndrome: Secondary | ICD-10-CM | POA: Diagnosis not present

## 2020-10-18 DIAGNOSIS — E782 Mixed hyperlipidemia: Secondary | ICD-10-CM | POA: Diagnosis not present

## 2020-10-24 DIAGNOSIS — K219 Gastro-esophageal reflux disease without esophagitis: Secondary | ICD-10-CM | POA: Diagnosis not present

## 2020-10-24 DIAGNOSIS — R69 Illness, unspecified: Secondary | ICD-10-CM | POA: Diagnosis not present

## 2020-10-24 DIAGNOSIS — E782 Mixed hyperlipidemia: Secondary | ICD-10-CM | POA: Diagnosis not present

## 2020-10-24 DIAGNOSIS — I10 Essential (primary) hypertension: Secondary | ICD-10-CM | POA: Diagnosis not present

## 2020-10-24 DIAGNOSIS — M810 Age-related osteoporosis without current pathological fracture: Secondary | ICD-10-CM | POA: Diagnosis not present

## 2020-11-22 DIAGNOSIS — K219 Gastro-esophageal reflux disease without esophagitis: Secondary | ICD-10-CM | POA: Diagnosis not present

## 2020-11-22 DIAGNOSIS — M810 Age-related osteoporosis without current pathological fracture: Secondary | ICD-10-CM | POA: Diagnosis not present

## 2020-11-22 DIAGNOSIS — I10 Essential (primary) hypertension: Secondary | ICD-10-CM | POA: Diagnosis not present

## 2020-11-22 DIAGNOSIS — E782 Mixed hyperlipidemia: Secondary | ICD-10-CM | POA: Diagnosis not present

## 2020-11-22 DIAGNOSIS — R69 Illness, unspecified: Secondary | ICD-10-CM | POA: Diagnosis not present

## 2020-12-13 DIAGNOSIS — I10 Essential (primary) hypertension: Secondary | ICD-10-CM | POA: Diagnosis not present

## 2020-12-13 DIAGNOSIS — R69 Illness, unspecified: Secondary | ICD-10-CM | POA: Diagnosis not present

## 2020-12-13 DIAGNOSIS — K219 Gastro-esophageal reflux disease without esophagitis: Secondary | ICD-10-CM | POA: Diagnosis not present

## 2020-12-13 DIAGNOSIS — M810 Age-related osteoporosis without current pathological fracture: Secondary | ICD-10-CM | POA: Diagnosis not present

## 2020-12-13 DIAGNOSIS — E782 Mixed hyperlipidemia: Secondary | ICD-10-CM | POA: Diagnosis not present

## 2021-01-22 DIAGNOSIS — R69 Illness, unspecified: Secondary | ICD-10-CM | POA: Diagnosis not present

## 2021-01-22 DIAGNOSIS — G894 Chronic pain syndrome: Secondary | ICD-10-CM | POA: Diagnosis not present

## 2021-01-22 DIAGNOSIS — R197 Diarrhea, unspecified: Secondary | ICD-10-CM | POA: Diagnosis not present

## 2021-01-22 DIAGNOSIS — E782 Mixed hyperlipidemia: Secondary | ICD-10-CM | POA: Diagnosis not present

## 2021-01-24 DIAGNOSIS — E782 Mixed hyperlipidemia: Secondary | ICD-10-CM | POA: Diagnosis not present

## 2021-01-24 DIAGNOSIS — R69 Illness, unspecified: Secondary | ICD-10-CM | POA: Diagnosis not present

## 2021-01-24 DIAGNOSIS — I10 Essential (primary) hypertension: Secondary | ICD-10-CM | POA: Diagnosis not present

## 2021-01-24 DIAGNOSIS — K219 Gastro-esophageal reflux disease without esophagitis: Secondary | ICD-10-CM | POA: Diagnosis not present

## 2021-01-24 DIAGNOSIS — M810 Age-related osteoporosis without current pathological fracture: Secondary | ICD-10-CM | POA: Diagnosis not present

## 2021-02-07 DIAGNOSIS — R69 Illness, unspecified: Secondary | ICD-10-CM | POA: Diagnosis not present

## 2021-02-07 DIAGNOSIS — E782 Mixed hyperlipidemia: Secondary | ICD-10-CM | POA: Diagnosis not present

## 2021-02-07 DIAGNOSIS — K219 Gastro-esophageal reflux disease without esophagitis: Secondary | ICD-10-CM | POA: Diagnosis not present

## 2021-02-07 DIAGNOSIS — M810 Age-related osteoporosis without current pathological fracture: Secondary | ICD-10-CM | POA: Diagnosis not present

## 2021-02-07 DIAGNOSIS — I10 Essential (primary) hypertension: Secondary | ICD-10-CM | POA: Diagnosis not present

## 2021-02-20 DIAGNOSIS — R159 Full incontinence of feces: Secondary | ICD-10-CM | POA: Diagnosis not present

## 2021-02-20 DIAGNOSIS — K529 Noninfective gastroenteritis and colitis, unspecified: Secondary | ICD-10-CM | POA: Diagnosis not present

## 2021-02-20 DIAGNOSIS — Z1211 Encounter for screening for malignant neoplasm of colon: Secondary | ICD-10-CM | POA: Diagnosis not present

## 2021-03-08 DIAGNOSIS — K219 Gastro-esophageal reflux disease without esophagitis: Secondary | ICD-10-CM | POA: Diagnosis not present

## 2021-03-08 DIAGNOSIS — E782 Mixed hyperlipidemia: Secondary | ICD-10-CM | POA: Diagnosis not present

## 2021-03-08 DIAGNOSIS — I10 Essential (primary) hypertension: Secondary | ICD-10-CM | POA: Diagnosis not present

## 2021-03-08 DIAGNOSIS — R69 Illness, unspecified: Secondary | ICD-10-CM | POA: Diagnosis not present

## 2021-03-08 DIAGNOSIS — M810 Age-related osteoporosis without current pathological fracture: Secondary | ICD-10-CM | POA: Diagnosis not present

## 2021-03-08 DIAGNOSIS — Z23 Encounter for immunization: Secondary | ICD-10-CM | POA: Diagnosis not present

## 2021-03-15 DIAGNOSIS — K6289 Other specified diseases of anus and rectum: Secondary | ICD-10-CM | POA: Diagnosis not present

## 2021-03-15 DIAGNOSIS — K649 Unspecified hemorrhoids: Secondary | ICD-10-CM | POA: Diagnosis not present

## 2021-03-15 DIAGNOSIS — K529 Noninfective gastroenteritis and colitis, unspecified: Secondary | ICD-10-CM | POA: Diagnosis not present

## 2021-03-15 DIAGNOSIS — K635 Polyp of colon: Secondary | ICD-10-CM | POA: Diagnosis not present

## 2021-03-15 DIAGNOSIS — K52832 Lymphocytic colitis: Secondary | ICD-10-CM | POA: Diagnosis not present

## 2021-03-15 DIAGNOSIS — K5730 Diverticulosis of large intestine without perforation or abscess without bleeding: Secondary | ICD-10-CM | POA: Diagnosis not present

## 2021-03-21 DIAGNOSIS — K6289 Other specified diseases of anus and rectum: Secondary | ICD-10-CM | POA: Diagnosis not present

## 2021-03-21 DIAGNOSIS — K52832 Lymphocytic colitis: Secondary | ICD-10-CM | POA: Diagnosis not present

## 2021-03-21 DIAGNOSIS — K529 Noninfective gastroenteritis and colitis, unspecified: Secondary | ICD-10-CM | POA: Diagnosis not present

## 2021-03-29 DIAGNOSIS — K52832 Lymphocytic colitis: Secondary | ICD-10-CM | POA: Diagnosis not present

## 2021-03-29 DIAGNOSIS — Z23 Encounter for immunization: Secondary | ICD-10-CM | POA: Diagnosis not present

## 2021-03-29 DIAGNOSIS — Z0000 Encounter for general adult medical examination without abnormal findings: Secondary | ICD-10-CM | POA: Diagnosis not present

## 2021-03-29 DIAGNOSIS — R69 Illness, unspecified: Secondary | ICD-10-CM | POA: Diagnosis not present

## 2021-03-29 DIAGNOSIS — E782 Mixed hyperlipidemia: Secondary | ICD-10-CM | POA: Diagnosis not present

## 2021-03-29 DIAGNOSIS — G894 Chronic pain syndrome: Secondary | ICD-10-CM | POA: Diagnosis not present

## 2021-03-29 DIAGNOSIS — I10 Essential (primary) hypertension: Secondary | ICD-10-CM | POA: Diagnosis not present

## 2021-04-24 DIAGNOSIS — I10 Essential (primary) hypertension: Secondary | ICD-10-CM | POA: Diagnosis not present

## 2021-04-24 DIAGNOSIS — M810 Age-related osteoporosis without current pathological fracture: Secondary | ICD-10-CM | POA: Diagnosis not present

## 2021-04-24 DIAGNOSIS — K219 Gastro-esophageal reflux disease without esophagitis: Secondary | ICD-10-CM | POA: Diagnosis not present

## 2021-04-24 DIAGNOSIS — E782 Mixed hyperlipidemia: Secondary | ICD-10-CM | POA: Diagnosis not present

## 2021-04-24 DIAGNOSIS — R69 Illness, unspecified: Secondary | ICD-10-CM | POA: Diagnosis not present

## 2021-05-09 DIAGNOSIS — M810 Age-related osteoporosis without current pathological fracture: Secondary | ICD-10-CM | POA: Diagnosis not present

## 2021-05-09 DIAGNOSIS — I10 Essential (primary) hypertension: Secondary | ICD-10-CM | POA: Diagnosis not present

## 2021-05-09 DIAGNOSIS — E782 Mixed hyperlipidemia: Secondary | ICD-10-CM | POA: Diagnosis not present

## 2021-05-09 DIAGNOSIS — K219 Gastro-esophageal reflux disease without esophagitis: Secondary | ICD-10-CM | POA: Diagnosis not present

## 2021-05-09 DIAGNOSIS — R69 Illness, unspecified: Secondary | ICD-10-CM | POA: Diagnosis not present

## 2021-05-14 DIAGNOSIS — K52832 Lymphocytic colitis: Secondary | ICD-10-CM | POA: Diagnosis not present

## 2021-06-22 DIAGNOSIS — E782 Mixed hyperlipidemia: Secondary | ICD-10-CM | POA: Diagnosis not present

## 2021-06-22 DIAGNOSIS — R69 Illness, unspecified: Secondary | ICD-10-CM | POA: Diagnosis not present

## 2021-06-22 DIAGNOSIS — I10 Essential (primary) hypertension: Secondary | ICD-10-CM | POA: Diagnosis not present

## 2021-06-22 DIAGNOSIS — K219 Gastro-esophageal reflux disease without esophagitis: Secondary | ICD-10-CM | POA: Diagnosis not present

## 2021-06-22 DIAGNOSIS — M810 Age-related osteoporosis without current pathological fracture: Secondary | ICD-10-CM | POA: Diagnosis not present

## 2021-07-12 DIAGNOSIS — R69 Illness, unspecified: Secondary | ICD-10-CM | POA: Diagnosis not present

## 2021-07-12 DIAGNOSIS — F324 Major depressive disorder, single episode, in partial remission: Secondary | ICD-10-CM | POA: Diagnosis not present

## 2021-07-12 DIAGNOSIS — E782 Mixed hyperlipidemia: Secondary | ICD-10-CM | POA: Diagnosis not present

## 2021-07-12 DIAGNOSIS — I10 Essential (primary) hypertension: Secondary | ICD-10-CM | POA: Diagnosis not present

## 2021-07-19 DIAGNOSIS — K52832 Lymphocytic colitis: Secondary | ICD-10-CM | POA: Diagnosis not present

## 2021-07-24 DIAGNOSIS — H11823 Conjunctivochalasis, bilateral: Secondary | ICD-10-CM | POA: Diagnosis not present

## 2021-07-24 DIAGNOSIS — H2513 Age-related nuclear cataract, bilateral: Secondary | ICD-10-CM | POA: Diagnosis not present

## 2021-07-24 DIAGNOSIS — H10413 Chronic giant papillary conjunctivitis, bilateral: Secondary | ICD-10-CM | POA: Diagnosis not present

## 2021-07-24 DIAGNOSIS — H0102A Squamous blepharitis right eye, upper and lower eyelids: Secondary | ICD-10-CM | POA: Diagnosis not present

## 2021-07-24 DIAGNOSIS — H04123 Dry eye syndrome of bilateral lacrimal glands: Secondary | ICD-10-CM | POA: Diagnosis not present

## 2021-07-24 DIAGNOSIS — H0102B Squamous blepharitis left eye, upper and lower eyelids: Secondary | ICD-10-CM | POA: Diagnosis not present

## 2021-08-02 DIAGNOSIS — E782 Mixed hyperlipidemia: Secondary | ICD-10-CM | POA: Diagnosis not present

## 2021-08-02 DIAGNOSIS — I10 Essential (primary) hypertension: Secondary | ICD-10-CM | POA: Diagnosis not present

## 2021-08-02 DIAGNOSIS — R69 Illness, unspecified: Secondary | ICD-10-CM | POA: Diagnosis not present

## 2021-08-30 DIAGNOSIS — I10 Essential (primary) hypertension: Secondary | ICD-10-CM | POA: Diagnosis not present

## 2021-08-30 DIAGNOSIS — E782 Mixed hyperlipidemia: Secondary | ICD-10-CM | POA: Diagnosis not present

## 2021-08-30 DIAGNOSIS — R69 Illness, unspecified: Secondary | ICD-10-CM | POA: Diagnosis not present

## 2021-08-30 DIAGNOSIS — K52832 Lymphocytic colitis: Secondary | ICD-10-CM | POA: Diagnosis not present

## 2021-08-30 DIAGNOSIS — G894 Chronic pain syndrome: Secondary | ICD-10-CM | POA: Diagnosis not present

## 2021-09-20 DIAGNOSIS — H2512 Age-related nuclear cataract, left eye: Secondary | ICD-10-CM | POA: Diagnosis not present

## 2021-09-22 DIAGNOSIS — H43392 Other vitreous opacities, left eye: Secondary | ICD-10-CM | POA: Diagnosis not present

## 2021-09-22 DIAGNOSIS — H35363 Drusen (degenerative) of macula, bilateral: Secondary | ICD-10-CM | POA: Diagnosis not present

## 2021-09-22 DIAGNOSIS — H25811 Combined forms of age-related cataract, right eye: Secondary | ICD-10-CM | POA: Diagnosis not present

## 2021-09-22 DIAGNOSIS — H43812 Vitreous degeneration, left eye: Secondary | ICD-10-CM | POA: Diagnosis not present

## 2021-09-22 DIAGNOSIS — H53142 Visual discomfort, left eye: Secondary | ICD-10-CM | POA: Diagnosis not present

## 2021-10-28 DIAGNOSIS — H2511 Age-related nuclear cataract, right eye: Secondary | ICD-10-CM | POA: Diagnosis not present

## 2021-11-01 DIAGNOSIS — H2511 Age-related nuclear cataract, right eye: Secondary | ICD-10-CM | POA: Diagnosis not present

## 2021-11-07 DIAGNOSIS — K52832 Lymphocytic colitis: Secondary | ICD-10-CM | POA: Diagnosis not present

## 2022-02-04 DIAGNOSIS — E782 Mixed hyperlipidemia: Secondary | ICD-10-CM | POA: Diagnosis not present

## 2022-02-04 DIAGNOSIS — R69 Illness, unspecified: Secondary | ICD-10-CM | POA: Diagnosis not present

## 2022-02-04 DIAGNOSIS — K219 Gastro-esophageal reflux disease without esophagitis: Secondary | ICD-10-CM | POA: Diagnosis not present

## 2022-02-04 DIAGNOSIS — I10 Essential (primary) hypertension: Secondary | ICD-10-CM | POA: Diagnosis not present

## 2022-02-04 DIAGNOSIS — G894 Chronic pain syndrome: Secondary | ICD-10-CM | POA: Diagnosis not present

## 2022-02-04 DIAGNOSIS — K52832 Lymphocytic colitis: Secondary | ICD-10-CM | POA: Diagnosis not present

## 2022-05-28 DIAGNOSIS — E782 Mixed hyperlipidemia: Secondary | ICD-10-CM | POA: Diagnosis not present

## 2022-05-28 DIAGNOSIS — I10 Essential (primary) hypertension: Secondary | ICD-10-CM | POA: Diagnosis not present

## 2022-05-28 DIAGNOSIS — R69 Illness, unspecified: Secondary | ICD-10-CM | POA: Diagnosis not present

## 2022-05-28 DIAGNOSIS — G894 Chronic pain syndrome: Secondary | ICD-10-CM | POA: Diagnosis not present

## 2022-05-28 DIAGNOSIS — M810 Age-related osteoporosis without current pathological fracture: Secondary | ICD-10-CM | POA: Diagnosis not present

## 2022-05-28 DIAGNOSIS — F419 Anxiety disorder, unspecified: Secondary | ICD-10-CM | POA: Diagnosis not present

## 2022-05-28 DIAGNOSIS — K219 Gastro-esophageal reflux disease without esophagitis: Secondary | ICD-10-CM | POA: Diagnosis not present

## 2022-05-28 DIAGNOSIS — K52832 Lymphocytic colitis: Secondary | ICD-10-CM | POA: Diagnosis not present

## 2022-05-28 DIAGNOSIS — F324 Major depressive disorder, single episode, in partial remission: Secondary | ICD-10-CM | POA: Diagnosis not present

## 2022-05-28 DIAGNOSIS — Z0000 Encounter for general adult medical examination without abnormal findings: Secondary | ICD-10-CM | POA: Diagnosis not present

## 2022-05-29 ENCOUNTER — Other Ambulatory Visit: Payer: Self-pay | Admitting: Family Medicine

## 2022-05-29 DIAGNOSIS — M810 Age-related osteoporosis without current pathological fracture: Secondary | ICD-10-CM

## 2022-06-05 DIAGNOSIS — K52831 Collagenous colitis: Secondary | ICD-10-CM | POA: Diagnosis not present

## 2022-06-05 DIAGNOSIS — R197 Diarrhea, unspecified: Secondary | ICD-10-CM | POA: Diagnosis not present

## 2022-07-30 ENCOUNTER — Other Ambulatory Visit: Payer: Self-pay | Admitting: Family Medicine

## 2022-07-30 DIAGNOSIS — M810 Age-related osteoporosis without current pathological fracture: Secondary | ICD-10-CM

## 2022-07-31 ENCOUNTER — Ambulatory Visit: Payer: PRIVATE HEALTH INSURANCE

## 2022-08-02 ENCOUNTER — Ambulatory Visit: Payer: PRIVATE HEALTH INSURANCE

## 2022-08-07 DIAGNOSIS — R058 Other specified cough: Secondary | ICD-10-CM | POA: Diagnosis not present

## 2022-08-07 DIAGNOSIS — R509 Fever, unspecified: Secondary | ICD-10-CM | POA: Diagnosis not present

## 2022-08-07 DIAGNOSIS — J40 Bronchitis, not specified as acute or chronic: Secondary | ICD-10-CM | POA: Diagnosis not present

## 2022-08-27 DIAGNOSIS — R69 Illness, unspecified: Secondary | ICD-10-CM | POA: Diagnosis not present

## 2022-08-27 DIAGNOSIS — K219 Gastro-esophageal reflux disease without esophagitis: Secondary | ICD-10-CM | POA: Diagnosis not present

## 2022-08-27 DIAGNOSIS — I10 Essential (primary) hypertension: Secondary | ICD-10-CM | POA: Diagnosis not present

## 2022-08-27 DIAGNOSIS — M810 Age-related osteoporosis without current pathological fracture: Secondary | ICD-10-CM | POA: Diagnosis not present

## 2022-08-27 DIAGNOSIS — G894 Chronic pain syndrome: Secondary | ICD-10-CM | POA: Diagnosis not present

## 2022-08-27 DIAGNOSIS — E782 Mixed hyperlipidemia: Secondary | ICD-10-CM | POA: Diagnosis not present

## 2022-08-27 DIAGNOSIS — K52832 Lymphocytic colitis: Secondary | ICD-10-CM | POA: Diagnosis not present

## 2022-09-10 ENCOUNTER — Ambulatory Visit
Admission: RE | Admit: 2022-09-10 | Discharge: 2022-09-10 | Disposition: A | Discharge status: Home / Self Care | Payer: Medicare HMO | Source: Ambulatory Visit | Attending: Family Medicine | Admitting: Family Medicine

## 2022-09-10 DIAGNOSIS — Z1231 Encounter for screening mammogram for malignant neoplasm of breast: Secondary | ICD-10-CM | POA: Diagnosis not present

## 2022-09-10 DIAGNOSIS — M810 Age-related osteoporosis without current pathological fracture: Secondary | ICD-10-CM

## 2022-09-27 DIAGNOSIS — R058 Other specified cough: Secondary | ICD-10-CM | POA: Diagnosis not present

## 2022-09-27 DIAGNOSIS — J40 Bronchitis, not specified as acute or chronic: Secondary | ICD-10-CM | POA: Diagnosis not present

## 2022-09-27 DIAGNOSIS — G894 Chronic pain syndrome: Secondary | ICD-10-CM | POA: Diagnosis not present

## 2022-12-20 DIAGNOSIS — E782 Mixed hyperlipidemia: Secondary | ICD-10-CM | POA: Diagnosis not present

## 2022-12-20 DIAGNOSIS — F419 Anxiety disorder, unspecified: Secondary | ICD-10-CM | POA: Diagnosis not present

## 2022-12-20 DIAGNOSIS — K219 Gastro-esophageal reflux disease without esophagitis: Secondary | ICD-10-CM | POA: Diagnosis not present

## 2022-12-20 DIAGNOSIS — G894 Chronic pain syndrome: Secondary | ICD-10-CM | POA: Diagnosis not present

## 2022-12-20 DIAGNOSIS — I10 Essential (primary) hypertension: Secondary | ICD-10-CM | POA: Diagnosis not present

## 2022-12-20 DIAGNOSIS — F324 Major depressive disorder, single episode, in partial remission: Secondary | ICD-10-CM | POA: Diagnosis not present

## 2022-12-20 DIAGNOSIS — K52832 Lymphocytic colitis: Secondary | ICD-10-CM | POA: Diagnosis not present

## 2023-01-14 ENCOUNTER — Other Ambulatory Visit: Payer: Self-pay | Admitting: Family Medicine

## 2023-01-14 DIAGNOSIS — M810 Age-related osteoporosis without current pathological fracture: Secondary | ICD-10-CM

## 2023-01-16 ENCOUNTER — Other Ambulatory Visit: Payer: PRIVATE HEALTH INSURANCE

## 2023-03-17 DIAGNOSIS — E782 Mixed hyperlipidemia: Secondary | ICD-10-CM | POA: Diagnosis not present

## 2023-03-17 DIAGNOSIS — J40 Bronchitis, not specified as acute or chronic: Secondary | ICD-10-CM | POA: Diagnosis not present

## 2023-03-17 DIAGNOSIS — K219 Gastro-esophageal reflux disease without esophagitis: Secondary | ICD-10-CM | POA: Diagnosis not present

## 2023-03-17 DIAGNOSIS — F324 Major depressive disorder, single episode, in partial remission: Secondary | ICD-10-CM | POA: Diagnosis not present

## 2023-03-17 DIAGNOSIS — G894 Chronic pain syndrome: Secondary | ICD-10-CM | POA: Diagnosis not present

## 2023-03-17 DIAGNOSIS — I10 Essential (primary) hypertension: Secondary | ICD-10-CM | POA: Diagnosis not present

## 2023-03-17 DIAGNOSIS — K52832 Lymphocytic colitis: Secondary | ICD-10-CM | POA: Diagnosis not present

## 2023-03-17 DIAGNOSIS — F419 Anxiety disorder, unspecified: Secondary | ICD-10-CM | POA: Diagnosis not present

## 2023-06-12 DIAGNOSIS — E782 Mixed hyperlipidemia: Secondary | ICD-10-CM | POA: Diagnosis not present

## 2023-06-12 DIAGNOSIS — I10 Essential (primary) hypertension: Secondary | ICD-10-CM | POA: Diagnosis not present

## 2023-06-18 DIAGNOSIS — Z0000 Encounter for general adult medical examination without abnormal findings: Secondary | ICD-10-CM | POA: Diagnosis not present

## 2023-06-18 DIAGNOSIS — F324 Major depressive disorder, single episode, in partial remission: Secondary | ICD-10-CM | POA: Diagnosis not present

## 2023-06-18 DIAGNOSIS — K52832 Lymphocytic colitis: Secondary | ICD-10-CM | POA: Diagnosis not present

## 2023-06-18 DIAGNOSIS — K219 Gastro-esophageal reflux disease without esophagitis: Secondary | ICD-10-CM | POA: Diagnosis not present

## 2023-06-18 DIAGNOSIS — M810 Age-related osteoporosis without current pathological fracture: Secondary | ICD-10-CM | POA: Diagnosis not present

## 2023-06-18 DIAGNOSIS — E782 Mixed hyperlipidemia: Secondary | ICD-10-CM | POA: Diagnosis not present

## 2023-06-18 DIAGNOSIS — G894 Chronic pain syndrome: Secondary | ICD-10-CM | POA: Diagnosis not present

## 2023-06-18 DIAGNOSIS — I10 Essential (primary) hypertension: Secondary | ICD-10-CM | POA: Diagnosis not present

## 2023-06-23 ENCOUNTER — Other Ambulatory Visit: Payer: Self-pay | Admitting: Family Medicine

## 2023-06-23 DIAGNOSIS — Z1231 Encounter for screening mammogram for malignant neoplasm of breast: Secondary | ICD-10-CM

## 2023-06-26 DIAGNOSIS — K52831 Collagenous colitis: Secondary | ICD-10-CM | POA: Diagnosis not present

## 2023-07-18 ENCOUNTER — Other Ambulatory Visit: Payer: PRIVATE HEALTH INSURANCE

## 2023-07-21 ENCOUNTER — Other Ambulatory Visit: Payer: Self-pay | Admitting: Family Medicine

## 2023-07-21 ENCOUNTER — Other Ambulatory Visit: Payer: PRIVATE HEALTH INSURANCE

## 2023-07-21 DIAGNOSIS — M810 Age-related osteoporosis without current pathological fracture: Secondary | ICD-10-CM

## 2023-08-04 ENCOUNTER — Other Ambulatory Visit: Payer: Self-pay | Admitting: Family Medicine

## 2023-08-04 ENCOUNTER — Ambulatory Visit
Admission: RE | Admit: 2023-08-04 | Discharge: 2023-08-04 | Disposition: A | Discharge status: Home / Self Care | Payer: PRIVATE HEALTH INSURANCE | Source: Ambulatory Visit | Attending: Family Medicine | Admitting: Family Medicine

## 2023-08-04 DIAGNOSIS — R600 Localized edema: Secondary | ICD-10-CM

## 2023-08-04 DIAGNOSIS — I872 Venous insufficiency (chronic) (peripheral): Secondary | ICD-10-CM | POA: Diagnosis not present

## 2023-09-11 ENCOUNTER — Ambulatory Visit: Payer: PRIVATE HEALTH INSURANCE

## 2023-09-23 DIAGNOSIS — K52832 Lymphocytic colitis: Secondary | ICD-10-CM | POA: Diagnosis not present

## 2023-09-23 DIAGNOSIS — F324 Major depressive disorder, single episode, in partial remission: Secondary | ICD-10-CM | POA: Diagnosis not present

## 2023-09-23 DIAGNOSIS — G894 Chronic pain syndrome: Secondary | ICD-10-CM | POA: Diagnosis not present

## 2023-09-23 DIAGNOSIS — K219 Gastro-esophageal reflux disease without esophagitis: Secondary | ICD-10-CM | POA: Diagnosis not present

## 2023-09-23 DIAGNOSIS — E782 Mixed hyperlipidemia: Secondary | ICD-10-CM | POA: Diagnosis not present

## 2023-09-23 DIAGNOSIS — M79604 Pain in right leg: Secondary | ICD-10-CM | POA: Diagnosis not present

## 2023-09-23 DIAGNOSIS — I10 Essential (primary) hypertension: Secondary | ICD-10-CM | POA: Diagnosis not present

## 2023-09-30 ENCOUNTER — Other Ambulatory Visit: Payer: Self-pay | Admitting: Family Medicine

## 2023-09-30 DIAGNOSIS — M79604 Pain in right leg: Secondary | ICD-10-CM

## 2023-10-01 ENCOUNTER — Ambulatory Visit
Admission: RE | Admit: 2023-10-01 | Discharge: 2023-10-01 | Disposition: A | Discharge status: Home / Self Care | Payer: PRIVATE HEALTH INSURANCE | Source: Ambulatory Visit | Attending: Family Medicine | Admitting: Family Medicine

## 2023-10-01 DIAGNOSIS — Z1231 Encounter for screening mammogram for malignant neoplasm of breast: Secondary | ICD-10-CM | POA: Diagnosis not present

## 2023-10-03 ENCOUNTER — Other Ambulatory Visit: Payer: Self-pay | Admitting: Family Medicine

## 2023-10-03 DIAGNOSIS — R928 Other abnormal and inconclusive findings on diagnostic imaging of breast: Secondary | ICD-10-CM

## 2023-10-06 ENCOUNTER — Other Ambulatory Visit

## 2023-10-08 ENCOUNTER — Ambulatory Visit
Admission: RE | Admit: 2023-10-08 | Discharge: 2023-10-08 | Discharge status: Home / Self Care | Source: Ambulatory Visit | Attending: Family Medicine | Admitting: Family Medicine

## 2023-10-08 ENCOUNTER — Ambulatory Visit

## 2023-10-08 DIAGNOSIS — R928 Other abnormal and inconclusive findings on diagnostic imaging of breast: Secondary | ICD-10-CM | POA: Diagnosis not present

## 2023-10-09 ENCOUNTER — Ambulatory Visit
Admission: RE | Admit: 2023-10-09 | Discharge: 2023-10-09 | Disposition: A | Discharge status: Home / Self Care | Source: Ambulatory Visit | Attending: Family Medicine | Admitting: Family Medicine

## 2023-10-09 DIAGNOSIS — M79604 Pain in right leg: Secondary | ICD-10-CM

## 2023-10-09 DIAGNOSIS — M79606 Pain in leg, unspecified: Secondary | ICD-10-CM | POA: Diagnosis not present

## 2023-10-22 ENCOUNTER — Encounter

## 2023-10-22 ENCOUNTER — Other Ambulatory Visit

## 2023-10-29 DIAGNOSIS — M5416 Radiculopathy, lumbar region: Secondary | ICD-10-CM | POA: Diagnosis not present

## 2023-11-19 DIAGNOSIS — M48062 Spinal stenosis, lumbar region with neurogenic claudication: Secondary | ICD-10-CM | POA: Diagnosis not present

## 2023-12-05 DIAGNOSIS — M48062 Spinal stenosis, lumbar region with neurogenic claudication: Secondary | ICD-10-CM | POA: Diagnosis not present

## 2023-12-10 DIAGNOSIS — M48062 Spinal stenosis, lumbar region with neurogenic claudication: Secondary | ICD-10-CM | POA: Diagnosis not present

## 2023-12-18 DIAGNOSIS — K219 Gastro-esophageal reflux disease without esophagitis: Secondary | ICD-10-CM | POA: Diagnosis not present

## 2023-12-18 DIAGNOSIS — G894 Chronic pain syndrome: Secondary | ICD-10-CM | POA: Diagnosis not present

## 2023-12-18 DIAGNOSIS — E782 Mixed hyperlipidemia: Secondary | ICD-10-CM | POA: Diagnosis not present

## 2023-12-18 DIAGNOSIS — K52832 Lymphocytic colitis: Secondary | ICD-10-CM | POA: Diagnosis not present

## 2023-12-18 DIAGNOSIS — F324 Major depressive disorder, single episode, in partial remission: Secondary | ICD-10-CM | POA: Diagnosis not present

## 2023-12-18 DIAGNOSIS — M5416 Radiculopathy, lumbar region: Secondary | ICD-10-CM | POA: Diagnosis not present

## 2023-12-18 DIAGNOSIS — I10 Essential (primary) hypertension: Secondary | ICD-10-CM | POA: Diagnosis not present

## 2024-03-16 ENCOUNTER — Other Ambulatory Visit: Payer: PRIVATE HEALTH INSURANCE

## 2024-05-03 ENCOUNTER — Other Ambulatory Visit (HOSPITAL_BASED_OUTPATIENT_CLINIC_OR_DEPARTMENT_OTHER): Payer: PRIVATE HEALTH INSURANCE
